# Patient Record
Sex: Male | Born: 1970 | Race: White | Hispanic: No | Marital: Married | Smoking: Never smoker
Health system: Southern US, Community
[De-identification: ages and names within clinical notes are randomized; demographics above are authoritative.]

## PROBLEM LIST (undated history)

## (undated) DIAGNOSIS — E785 Hyperlipidemia, unspecified: Secondary | ICD-10-CM

## (undated) DIAGNOSIS — I2510 Atherosclerotic heart disease of native coronary artery without angina pectoris: Secondary | ICD-10-CM

## (undated) DIAGNOSIS — I10 Essential (primary) hypertension: Secondary | ICD-10-CM

## (undated) DIAGNOSIS — E119 Type 2 diabetes mellitus without complications: Secondary | ICD-10-CM

## (undated) DIAGNOSIS — R7302 Impaired glucose tolerance (oral): Secondary | ICD-10-CM

## (undated) HISTORY — DX: Hyperlipidemia, unspecified: E78.5

## (undated) HISTORY — DX: Atherosclerotic heart disease of native coronary artery without angina pectoris: I25.10

## (undated) HISTORY — DX: Type 2 diabetes mellitus without complications: E11.9

## (undated) HISTORY — DX: Essential (primary) hypertension: I10

## (undated) HISTORY — DX: Impaired glucose tolerance (oral): R73.02

## (undated) HISTORY — PX: NASAL SINUS SURGERY: SHX719

---

## 2001-08-09 ENCOUNTER — Emergency Department (HOSPITAL_COMMUNITY): Admission: EM | Admit: 2001-08-09 | Discharge: 2001-08-09 | Payer: Self-pay | Admitting: Emergency Medicine

## 2001-08-09 ENCOUNTER — Encounter: Payer: Self-pay | Admitting: Emergency Medicine

## 2007-03-01 ENCOUNTER — Emergency Department (HOSPITAL_COMMUNITY): Admission: EM | Admit: 2007-03-01 | Discharge: 2007-03-02 | Payer: Self-pay | Admitting: Emergency Medicine

## 2009-07-29 ENCOUNTER — Encounter: Admission: RE | Admit: 2009-07-29 | Discharge: 2009-07-29 | Payer: Self-pay | Admitting: Family Medicine

## 2015-02-25 ENCOUNTER — Telehealth: Payer: Self-pay | Admitting: Cardiology

## 2015-02-25 NOTE — Telephone Encounter (Signed)
Received [facility] [employer] for appointment with Dr Antoine PocheHochrein on 03/22/15.  [facility] for Dr Hochrein's schedule on 03/22/15.  lp

## 2015-03-22 ENCOUNTER — Ambulatory Visit (INDEPENDENT_AMBULATORY_CARE_PROVIDER_SITE_OTHER): Payer: 59 | Admitting: Cardiology

## 2015-03-22 ENCOUNTER — Other Ambulatory Visit: Payer: Self-pay | Admitting: Cardiology

## 2015-03-22 ENCOUNTER — Encounter: Payer: Self-pay | Admitting: Cardiology

## 2015-03-22 VITALS — BP 114/80 | Wt 221.8 lb

## 2015-03-22 DIAGNOSIS — R072 Precordial pain: Secondary | ICD-10-CM

## 2015-03-22 DIAGNOSIS — I10 Essential (primary) hypertension: Secondary | ICD-10-CM | POA: Diagnosis not present

## 2015-03-22 NOTE — Progress Notes (Signed)
Cardiology Office Note   Date:  03/23/2015   ID:  Brian GanderBryant Tung, DOB December 16, 1970, MRN [id number]  PCP:  Mickie HillierLITTLE,KEVIN LORNE, MD  Cardiologist:   Rollene RotundaJames Loneta Tamplin, MD   Chief Complaint  Patient presents with  . Chest Pain      History of Present Illness: Brian Duarte is a 44 y.o. male who presents for evaluation of chest pain. He has a remarkable family history for coronary disease. His identical twin brother just had 5 vessel bypass in both of his parents have early heart disease at a young age. The patient himself at the end of March had some chest discomfort while he was out on a vigorous walk. He notices burning after the walk. It did not occur during the walk. It stopped on its own. There were no associated symptoms. He had never had this before. He does not have any shortness of breath, PND or orthopnea. He does not have palpitations, presyncope or syncope. He doesn't exercise routinely. He's not had any prior cardiac testing or workup.    Past Medical History  Diagnosis Date  . Impaired glucose tolerance   . HTN (hypertension)   . Hyperlipidemia     Past Surgical History  Procedure Laterality Date  . Nasal sinus surgery       Current Outpatient Prescriptions  Medication Sig Dispense Refill  . aspirin 81 MG chewable tablet Chew 1 tablet by mouth daily.    Marland Kitchen. BENICAR HCT 40-25 MG per tablet Take 1 tablet by mouth daily.  1  . CRESTOR 10 MG tablet Take 10 mg by mouth daily.  1  . KLOR-CON M10 10 MEQ tablet Take 10 mEq by mouth daily.  12   No current facility-administered medications for this visit.    Allergies:   Review of patient's allergies indicates no known allergies.    Social History:  The patient  reports that he has never smoked. He does not have any smokeless tobacco history on file. He reports that he drinks about 0.6 oz of alcohol per week. He reports that he does not use illicit drugs.   Family History:  The patient's family history includes CAD (age of  onset: 1743) in his brother; CAD (age of onset: 5054) in his mother; CAD (age of onset: 7355) in his father; Diabetes in his mother.    ROS:  Please see the history of present illness.   Otherwise, review of systems are positive for none.   All other systems are reviewed and negative.    PHYSICAL EXAM: VS:  BP 114/80 mmHg  Wt 221 lb 12.8 oz (100.608 kg) , BMI There is no height on file to calculate BMI. GENERAL:  Well appearing HEENT:  Pupils equal round and reactive, fundi not visualized, oral mucosa unremarkable NECK:  No jugular venous distention, waveform within normal limits, carotid upstroke brisk and symmetric, no bruits, no thyromegaly LYMPHATICS:  No cervical, inguinal adenopathy LUNGS:  Clear to auscultation bilaterally BACK:  No CVA tenderness CHEST:  Unremarkable HEART:  PMI not displaced or sustained,S1 and S2 within normal limits, no S3, no S4, no clicks, no rubs, no murmurs ABD:  Flat, positive bowel sounds normal in frequency in pitch, no bruits, no rebound, no guarding, no midline pulsatile mass, no hepatomegaly, no splenomegaly EXT:  2 plus pulses throughout, no edema, no cyanosis no clubbing SKIN:  No rashes no nodules NEURO:  Cranial nerves II through XII grossly intact, motor grossly intact throughout PSYCH:  Cognitively intact, oriented  to person place and time    EKG:  EKG is ordered today. The ekg ordered today demonstrates normal sinus rhythm, rate 70, axis within normal limits, intervals within normal limits, no acute ST-T wave changes.   Recent Labs: No results found for requested labs within last 365 days.    Lipid Panel No results found for: CHOL, TRIG, HDL, CHOLHDL, VLDL, LDLCALC, LDLDIRECT    Wt Readings from Last 3 Encounters:  03/22/15 221 lb 12.8 oz (100.608 kg)      Other studies Reviewed: Additional studies/ records that were reviewed today include: Dr. Fredirick Maudlin office records. Review of the above records demonstrates:  Please see elsewhere in  the note.     ASSESSMENT AND PLAN:  CHEST PAIN:  The patient had one episode of exertional chest discomfort. He's had no other symptoms. However, he has a very significant family history. I'm going to exclude obstructive coronary disease which I think can be done with a reasonable degree of certainty with a maximal exercise treadmill test. I'm also going to risk stratify further with a coronary calcium score. He and I began the discussion about follow-up of any abnormal results and primary risk reduction.  DM:  The patient has borderline diabetes which we managed by his primary provider. I suspect this would be treatable with lifestyle changes.  DYSLIPIDEMIA:  Agree with aggressive risk reduction and lipid management given his family history and might suggest lipid fractionation.  HTN:  The blood pressure is at target. No change in medications is indicated. We will continue with therapeutic lifestyle changes (TLC).   Current medicines are reviewed at length with the patient today.  The patient does not have concerns regarding medicines.  The following changes have been made:  no change  Labs/ tests ordered today include:   Orders Placed This Encounter  Procedures  . CT Cardiac Scoring  . EKG 12-Lead  . Exercise tolerance test     Disposition:   FU with me in one year.     Signed, Rollene Rotunda, MD  03/23/2015 9:49 AM    [facility]

## 2015-03-22 NOTE — Patient Instructions (Signed)
Your physician wants you to follow-up in: 1 Year You will receive a reminder letter in the mail two months in advance. If you don't receive a letter, please call our office to schedule the follow-up appointment.  Your physician has requested that you have an exercise tolerance test. For further information please visit [email or web address]. Please also follow instruction sheet, as given.  Your Doctor has ordered a CT calcium score - this is done at our [facility] - [address]

## 2015-03-25 ENCOUNTER — Ambulatory Visit (INDEPENDENT_AMBULATORY_CARE_PROVIDER_SITE_OTHER)
Admission: RE | Admit: 2015-03-25 | Discharge: 2015-03-25 | Disposition: A | Discharge status: Home / Self Care | Payer: 59 | Source: Ambulatory Visit | Attending: Cardiology | Admitting: Cardiology

## 2015-03-25 DIAGNOSIS — R072 Precordial pain: Secondary | ICD-10-CM

## 2015-03-25 DIAGNOSIS — I10 Essential (primary) hypertension: Secondary | ICD-10-CM

## 2015-04-14 ENCOUNTER — Telehealth (HOSPITAL_COMMUNITY): Payer: Self-pay | Admitting: *Deleted

## 2015-04-14 NOTE — Telephone Encounter (Signed)
Left message on voicemail in reference to upcoming appointment scheduled for 04/16/15. Phone number given for a call back so details instructions can be given. Brian Duarte W   

## 2015-04-15 ENCOUNTER — Telehealth (HOSPITAL_COMMUNITY): Payer: Self-pay | Admitting: *Deleted

## 2015-04-15 NOTE — Telephone Encounter (Signed)
Patient given detailed instructions per Myocardial Perfusion Study Information Sheet for test on 04/16/15 at 1530. Patient verbalized understanding. Brayleigh Rybacki W   

## 2015-04-16 ENCOUNTER — Ambulatory Visit (HOSPITAL_COMMUNITY)
Admission: RE | Admit: 2015-04-16 | Discharge: 2015-04-16 | Disposition: A | Discharge status: Home / Self Care | Payer: 59 | Source: Ambulatory Visit | Attending: Cardiology | Admitting: Cardiology

## 2015-04-16 DIAGNOSIS — R072 Precordial pain: Secondary | ICD-10-CM | POA: Diagnosis not present

## 2015-04-16 LAB — EXERCISE TOLERANCE TEST
CHL CUP RESTING HR STRESS: 78 {beats}/min
CHL CUP STRESS STAGE 1 DBP: 82 mmHg
CHL CUP STRESS STAGE 1 GRADE: 0 %
CHL CUP STRESS STAGE 1 HR: 84 {beats}/min
CHL CUP STRESS STAGE 2 HR: 83 {beats}/min
CHL CUP STRESS STAGE 2 SPEED: 1 mph
CHL CUP STRESS STAGE 3 GRADE: 0.1 %
CHL CUP STRESS STAGE 3 SPEED: 1 mph
CHL CUP STRESS STAGE 4 SBP: 141 mmHg
CHL CUP STRESS STAGE 5 GRADE: 12 %
CHL CUP STRESS STAGE 6 DBP: 72 mmHg
CHL CUP STRESS STAGE 6 GRADE: 14 %
CHL CUP STRESS STAGE 6 HR: 151 {beats}/min
CHL CUP STRESS STAGE 7 HR: 164 {beats}/min
CHL CUP STRESS STAGE 7 SPEED: 4.2 mph
CHL CUP STRESS STAGE 8 GRADE: 0 %
CHL CUP STRESS STAGE 8 SBP: 128 mmHg
CHL CUP STRESS STAGE 8 SPEED: 0 mph
CHL CUP STRESS STAGE 9 GRADE: 0 %
CSEPED: 10 min
CSEPEW: 12.5 METS
CSEPHR: 92 %
CSEPPMHR: 92 %
Exercise duration (sec): 30 s
MPHR: 177 {beats}/min
Peak HR: 164 {beats}/min
RPE: 29143
Stage 1 SBP: 122 mmHg
Stage 1 Speed: 0 mph
Stage 2 Grade: 0 %
Stage 3 HR: 82 {beats}/min
Stage 4 DBP: 65 mmHg
Stage 4 Grade: 10 %
Stage 4 HR: 109 {beats}/min
Stage 4 Speed: 1.7 mph
Stage 5 DBP: 67 mmHg
Stage 5 HR: 130 {beats}/min
Stage 5 SBP: 169 mmHg
Stage 5 Speed: 2.5 mph
Stage 6 SBP: 193 mmHg
Stage 6 Speed: 3.4 mph
Stage 7 Grade: 16 %
Stage 8 DBP: 66 mmHg
Stage 8 HR: 137 {beats}/min
Stage 9 DBP: 61 mmHg
Stage 9 HR: 90 {beats}/min
Stage 9 SBP: 131 mmHg
Stage 9 Speed: 0 mph

## 2017-05-24 DIAGNOSIS — R7301 Impaired fasting glucose: Secondary | ICD-10-CM | POA: Diagnosis not present

## 2017-05-24 DIAGNOSIS — I10 Essential (primary) hypertension: Secondary | ICD-10-CM | POA: Diagnosis not present

## 2017-05-24 DIAGNOSIS — E782 Mixed hyperlipidemia: Secondary | ICD-10-CM | POA: Diagnosis not present

## 2017-05-24 DIAGNOSIS — Z125 Encounter for screening for malignant neoplasm of prostate: Secondary | ICD-10-CM | POA: Diagnosis not present

## 2018-04-03 DIAGNOSIS — R7301 Impaired fasting glucose: Secondary | ICD-10-CM | POA: Diagnosis not present

## 2018-04-03 DIAGNOSIS — I10 Essential (primary) hypertension: Secondary | ICD-10-CM | POA: Diagnosis not present

## 2018-04-03 DIAGNOSIS — Z125 Encounter for screening for malignant neoplasm of prostate: Secondary | ICD-10-CM | POA: Diagnosis not present

## 2018-04-03 DIAGNOSIS — E782 Mixed hyperlipidemia: Secondary | ICD-10-CM | POA: Diagnosis not present

## 2019-04-10 DIAGNOSIS — R7301 Impaired fasting glucose: Secondary | ICD-10-CM | POA: Diagnosis not present

## 2019-04-10 DIAGNOSIS — E782 Mixed hyperlipidemia: Secondary | ICD-10-CM | POA: Diagnosis not present

## 2019-04-10 DIAGNOSIS — I10 Essential (primary) hypertension: Secondary | ICD-10-CM | POA: Diagnosis not present

## 2020-11-28 NOTE — Progress Notes (Unsigned)
Cardiology Office Note:   Date:  11/30/2020  NAME:  Brian Duarte    MRN: [id number] DOB:  Mar 07, 1971   PCP:  Hulan Fess, MD  Cardiologist:  No primary care provider on file.   Referring MD: Hulan Fess, MD   Chief Complaint  Patient presents with  . Coronary Artery Disease   History of Present Illness:   Brian Duarte is a 50 y.o. male with a hx of CAD, HTN who is being seen today for the evaluation of CAD at the request of Little, Lennette Bihari, MD.  He reports on 11/22/2020 he was visiting family and had severely elevated blood pressure.  Blood pressure reported to be 170/100.  He reports he was dizzy and lightheaded.  He does take blood pressure medication and blood pressure has come down.  In office today 140/93.  He has seen his primary care physician who thinks the cuff was a bit too tight.  He has a strong family history of heart disease.  His twin brother had bypass surgery at age 74.  He had coronary calcium scoring 5 years ago that demonstrated a calcium score of 1.  His EKG today in office is normal sinus rhythm with no acute ischemic changes.  He recently had lab work by his primary care physician.  He is now diabetic with an A1c of 6.5.  Most recent TSH 2.2.  Most recent LDL cholesterol 79 in May 2021.  His BMI is 34.  No concerns for sleep apnea per his report.  He reports his diet could be improved.  This does consist of packaged food and processed food.  He also does not exercise currently.  He is hoping to get back into that.  Reports no significant symptoms concerning for angina.  He reports he can get short of breath when he exerts himself.  He also describes episodic burning in his chest that can go into the right chest.  Can occur at any time.  Does not appear to occur with exertion.  He had no what he describes as chest pain or pressure.  He is a never smoker.  Does not drink alcohol or use drugs.  He works in Librarian, academic.  He is married with 1 child.  Problem List 1.  CAD -CAC score 1 (27th percentile) 2016 2. HTN 3. DM -A1c 6.5 4. Obesity  -BMI 34 5.  Hyperlipidemia -Total cholesterol 138, HDL 38, LDL 79, triglycerides 117  Past Medical History: Past Medical History:  Diagnosis Date  . Coronary artery disease   . Diabetes mellitus without complication ([place])   . HTN (hypertension)   . Hyperlipidemia   . Impaired glucose tolerance     Past Surgical History: Past Surgical History:  Procedure Laterality Date  . NASAL SINUS SURGERY      Current Medications: Current Meds  Medication Sig  . rosuvastatin (CRESTOR) 20 MG tablet Take 1 tablet (20 mg total) by mouth daily.     Allergies:    Patient has no known allergies.   Social History: Social History   Socioeconomic History  . Marital status: Married    Spouse name: Not on file  . Number of children: 1  . Years of education: Not on file  . Highest education level: Not on file  Occupational History  . Not on file  Tobacco Use  . Smoking status: Never Smoker  . Smokeless tobacco: Current User    Types: Snuff  Substance and Sexual Activity  . Alcohol use: Yes  Alcohol/week: 1.0 standard drink    Types: 1 Cans of beer per week  . Drug use: No  . Sexual activity: Yes    Partners: Female  Other Topics Concern  . Not on file  Social History Narrative   Lives at home with wife and daughter.  Works for [employer] brands    Social Determinants of Corporate investment banker Strain: Not on file  Food Insecurity: Not on file  Transportation Needs: Not on file  Physical Activity: Not on file  Stress: Not on file  Social Connections: Not on file     Family History: The patient's family history includes CAD (age of onset: 80) in his brother; CAD (age of onset: 52) in his mother; CAD (age of onset: 14) in his father; Diabetes in his mother.  ROS:   All other ROS reviewed and negative. Pertinent positives noted in the HPI.     EKGs/Labs/Other Studies Reviewed:   The following  studies were personally reviewed by me today:  EKG:  EKG is ordered today.  The ekg ordered today demonstrates normal sinus rhythm, heart rate 70, no acute ischemic changes, no evidence of prior infarction, and was personally reviewed by me.   Recent Labs: No results found for requested labs within last 8760 hours.   Recent Lipid Panel No results found for: CHOL, TRIG, HDL, CHOLHDL, VLDL, LDLCALC, LDLDIRECT  Physical Exam:   VS:  BP (!) 140/93   Pulse 70   Temp 98.1 F (36.7 C)   Ht 5\' 9"  (1.753 m)   Wt 231 lb 9.6 oz (105.1 kg)   SpO2 96%   BMI 34.20 kg/m    Wt Readings from Last 3 Encounters:  11/30/20 231 lb 9.6 oz (105.1 kg)  03/22/15 221 lb 12.8 oz (100.6 kg)    General: Well nourished, well developed, in no acute distress Head: Atraumatic, normal size  Eyes: PEERLA, EOMI  Neck: Supple, no JVD Endocrine: No thryomegaly Cardiac: Normal S1, S2; RRR; no murmurs, rubs, or gallops Lungs: Clear to auscultation bilaterally, no wheezing, rhonchi or rales  Abd: Soft, nontender, no hepatomegaly  Ext: No edema, pulses 2+ Musculoskeletal: No deformities, BUE and BLE strength normal and equal Skin: Warm and dry, no rashes   Neuro: Alert and oriented to person, place, time, and situation, CNII-XII grossly intact, no focal deficits  Psych: Normal mood and affect   ASSESSMENT:   Brian Duarte is a 50 y.o. male who presents for the following: 1. Agatston coronary artery calcium score less than 100   2. Primary hypertension   3. Mixed hyperlipidemia   4. Obesity (BMI 30-39.9)     PLAN:   1. Agatston coronary artery calcium score less than 100 -Coronary calcium score of 1 (27th percentile) in 2016.  Very strong family history of heart disease.  His twin brother had bypass surgery at 60.  I would recommend to repeat a coronary calcium score.  I would also recommend he increase his Crestor to 20 mg a day.  I would get his LDL cholesterol less than 70 as he has a strong family history and  is diabetic with an A1c of 6.5. -EKG normal in office today.  He may need further stress testing pending coronary calcium scoring.  2. Primary hypertension -Blood pressure 140/93 in office.  He is on olmesartan and HCTZ.  We discussed ways to reduce salt consumption.  He also needs exercise.  Weight loss would help lower his blood pressure as well as  proper diet.  BMI is 34.  I think he is safe to exercise.  He will start to do this.  No concern for sleep apnea per his report.  Recent thyroid studies are normal.  3. Mixed hyperlipidemia -LDL 79.  Not at goal.  Given his diabetes need to get this less than 70.  We will increase his Crestor 20 mg a day.  Repeat calcium scoring as above.  4. Obesity (BMI 30-39.9) -Diet and exercise recommended.  Disposition: Return in about 3 months (around 02/28/2021).  Medication Adjustments/Labs and Tests Ordered: Current medicines are reviewed at length with the patient today.  Concerns regarding medicines are outlined above.  Orders Placed This Encounter  Procedures  . CT CARDIAC SCORING (SELF PAY ONLY)  . EKG 12-Lead   Meds ordered this encounter  Medications  . rosuvastatin (CRESTOR) 20 MG tablet    Sig: Take 1 tablet (20 mg total) by mouth daily.    Dispense:  90 tablet    Refill:  3    Patient Instructions  Medication Instructions:  Increase Crestor 20 mg daily   *If you need a refill on your cardiac medications before your next appointment, please call your pharmacy*   Testing/Procedures: CALCIUM SCORE    Follow-Up: At [facility], you and your health needs are our priority.  As part of our continuing mission to provide you with exceptional heart care, we have created designated Provider Care Teams.  These Care Teams include your primary Cardiologist (physician) and Advanced Practice Providers (APPs -  Physician Assistants and Nurse Practitioners) who all work together to provide you with the care you need, when you need it.  We  recommend signing up for the patient portal called "MyChart".  Sign up information is provided on this After Visit Summary.  MyChart is used to connect with patients for Virtual Visits (Telemedicine).  Patients are able to view lab/test results, encounter notes, upcoming appointments, etc.  Non-urgent messages can be sent to your provider as well.   To learn more about what you can do with MyChart, go to [email or web address].    Your next appointment:   3 month(s)  The format for your next appointment:   In Person  Provider:   Lennie Odor, MD   Other Instructions Use BP log to monitor blood pressures       Signed, Gerri Spore T. Flora Lipps, MD [facility]  [address], [address] [place], [place] [address] [phone]  11/30/2020 10:10 AM

## 2020-11-30 ENCOUNTER — Ambulatory Visit (INDEPENDENT_AMBULATORY_CARE_PROVIDER_SITE_OTHER): Payer: 59 | Admitting: Cardiovascular Disease

## 2020-11-30 ENCOUNTER — Encounter: Payer: Self-pay | Admitting: Cardiovascular Disease

## 2020-11-30 ENCOUNTER — Other Ambulatory Visit: Payer: Self-pay

## 2020-11-30 VITALS — BP 140/93 | HR 70 | Temp 98.1°F | Ht 69.0 in | Wt 231.6 lb

## 2020-11-30 DIAGNOSIS — R931 Abnormal findings on diagnostic imaging of heart and coronary circulation: Secondary | ICD-10-CM

## 2020-11-30 DIAGNOSIS — E669 Obesity, unspecified: Secondary | ICD-10-CM | POA: Diagnosis not present

## 2020-11-30 DIAGNOSIS — R9431 Abnormal electrocardiogram [ECG] [EKG]: Secondary | ICD-10-CM

## 2020-11-30 DIAGNOSIS — I10 Essential (primary) hypertension: Secondary | ICD-10-CM | POA: Diagnosis not present

## 2020-11-30 DIAGNOSIS — E782 Mixed hyperlipidemia: Secondary | ICD-10-CM

## 2020-11-30 MED ORDER — ROSUVASTATIN CALCIUM 20 MG PO TABS: 20.0000 mg | ORAL_TABLET | Freq: Every day | ORAL | 3 refills | Status: DC

## 2020-11-30 NOTE — Patient Instructions (Signed)
Medication Instructions:  Increase Crestor 20 mg daily   *If you need a refill on your cardiac medications before your next appointment, please call your pharmacy*   Testing/Procedures: CALCIUM SCORE    Follow-Up: At [facility], you and your health needs are our priority.  As part of our continuing mission to provide you with exceptional heart care, we have created designated Provider Care Teams.  These Care Teams include your primary Cardiologist (physician) and Advanced Practice Providers (APPs -  Physician Assistants and Nurse Practitioners) who all work together to provide you with the care you need, when you need it.  We recommend signing up for the patient portal called "MyChart".  Sign up information is provided on this After Visit Summary.  MyChart is used to connect with patients for Virtual Visits (Telemedicine).  Patients are able to view lab/test results, encounter notes, upcoming appointments, etc.  Non-urgent messages can be sent to your provider as well.   To learn more about what you can do with MyChart, go to [email or web address].    Your next appointment:   3 month(s)  The format for your next appointment:   In Person  Provider:   Lennie Odor, MD   Other Instructions Use BP log to monitor blood pressures

## 2020-12-02 ENCOUNTER — Ambulatory Visit (INDEPENDENT_AMBULATORY_CARE_PROVIDER_SITE_OTHER)
Admission: RE | Admit: 2020-12-02 | Discharge: 2020-12-02 | Disposition: A | Discharge status: Home / Self Care | Payer: Self-pay | Source: Ambulatory Visit | Attending: Cardiovascular Disease | Admitting: Cardiovascular Disease

## 2020-12-02 ENCOUNTER — Other Ambulatory Visit: Payer: Self-pay

## 2020-12-02 DIAGNOSIS — R931 Abnormal findings on diagnostic imaging of heart and coronary circulation: Secondary | ICD-10-CM

## 2020-12-03 ENCOUNTER — Other Ambulatory Visit: Payer: Self-pay

## 2020-12-03 DIAGNOSIS — I2510 Atherosclerotic heart disease of native coronary artery without angina pectoris: Secondary | ICD-10-CM

## 2020-12-03 DIAGNOSIS — R931 Abnormal findings on diagnostic imaging of heart and coronary circulation: Secondary | ICD-10-CM

## 2020-12-03 NOTE — Telephone Encounter (Signed)
Discussed calcium score with Brian Duarte. No concerning chest pain. I discussed with him that I think it is okay to proceed with a stress test.  There is no need for right heart catheterization at this time.  We will await the results of his stress test and then decide on any further testing he may need.  I am quite aware of his brothers medical history and that is why we pursued a repeat calcium scoring.  We will do everything we can to ensure his heart remains healthy and we stay on top of this.  Gerri Spore T. Flora Lipps, MD [facility]  [address], [address] [place], [place] [address] [phone]  3:09 PM

## 2020-12-06 ENCOUNTER — Other Ambulatory Visit: Payer: Self-pay

## 2020-12-06 ENCOUNTER — Other Ambulatory Visit (HOSPITAL_COMMUNITY)
Admission: RE | Admit: 2020-12-06 | Discharge: 2020-12-06 | Disposition: A | Discharge status: Home / Self Care | Payer: 59 | Source: Ambulatory Visit | Attending: Cardiovascular Disease | Admitting: Cardiovascular Disease

## 2020-12-06 DIAGNOSIS — Z20822 Contact with and (suspected) exposure to covid-19: Secondary | ICD-10-CM | POA: Diagnosis not present

## 2020-12-06 DIAGNOSIS — I2510 Atherosclerotic heart disease of native coronary artery without angina pectoris: Secondary | ICD-10-CM

## 2020-12-06 DIAGNOSIS — Z01812 Encounter for preprocedural laboratory examination: Secondary | ICD-10-CM | POA: Insufficient documentation

## 2020-12-06 LAB — SARS CORONAVIRUS 2 (TAT 6-24 HRS): SARS Coronavirus 2: NEGATIVE

## 2020-12-08 ENCOUNTER — Telehealth: Payer: Self-pay

## 2020-12-08 NOTE — Telephone Encounter (Signed)
Detailed instructions left on the patient's answering machine. Asked to call back with any questions. S.Fortune Brannigan EMTP 

## 2020-12-09 ENCOUNTER — Ambulatory Visit (HOSPITAL_COMMUNITY): Payer: 59 | Attending: Cardiovascular Disease

## 2020-12-09 ENCOUNTER — Other Ambulatory Visit: Payer: Self-pay

## 2020-12-09 DIAGNOSIS — I2510 Atherosclerotic heart disease of native coronary artery without angina pectoris: Secondary | ICD-10-CM | POA: Diagnosis not present

## 2020-12-09 LAB — MYOCARDIAL PERFUSION IMAGING
Estimated workload: 12.3 METS
Exercise duration (min): 8 min
Exercise duration (sec): 45 s
LV dias vol: 76 mL (ref 62–150)
LV sys vol: 34 mL
MPHR: 171 {beats}/min
Peak HR: 164 {beats}/min
Percent HR: 96 %
RPE: 19
Rest HR: 61 {beats}/min
SDS: 0
SRS: 0
SSS: 0
TID: 0.91

## 2020-12-09 MED ORDER — TECHNETIUM TC 99M TETROFOSMIN IV KIT
10.9000 | PACK | Freq: Once | INTRAVENOUS | Status: AC | PRN
  Administered 2020-12-09: 10.9 via INTRAVENOUS
  Filled 2020-12-09: qty 11

## 2020-12-09 MED ORDER — TECHNETIUM TC 99M TETROFOSMIN IV KIT
31.4000 | PACK | Freq: Once | INTRAVENOUS | Status: AC | PRN
  Administered 2020-12-09: 31.4 via INTRAVENOUS
  Filled 2020-12-09: qty 32

## 2020-12-13 NOTE — Progress Notes (Signed)
Virtual Visit via Video Note   This visit type was conducted due to national recommendations for restrictions regarding the COVID-19 Pandemic (e.g. social distancing) in an effort to limit this patient's exposure and mitigate transmission in our community.  Due to his co-morbid illnesses, this patient is at least at moderate risk for complications without adequate follow up.  This format is felt to be most appropriate for this patient at this time.  All issues noted in this document were discussed and addressed.  A limited physical exam was performed with this format.  Please refer to the patient's chart for his consent to telehealth for [facility].  Date:  12/14/2020   ID:  Brian Duarte, DOB 1971/06/19, MRN [id number] The patient was identified using 2 identifiers.  Patient Location: Home Provider Location: Office/Clinic  PCP:  Catha Gosselin, MD  Cardiologist:  No primary care provider on file.   Evaluation Performed:  Follow-Up Visit  Chief Complaint:  CAD  History of Present Illness:    Brian Duarte is a 50 y.o. male with CAD (elevated CAC score), DM, HLD, obesity who presents for follow-up. He reports he has been doing well. BP has been a bit elevated. No exercise until our discussion. Reports he still gets intermittent burning sensation in his chest. Non-exertional. Occurs at any time. Not that frequent. 2-3/week. Lasts minutes. No exertional CP or pressure. Crestor increased to 20 mg daily. Reports elevated liver enzymes and he will see his PCP to discuss tomorrow. I have asked he forward the results. We discussed diet and exercise goals as well as weight loss goals. He reports he is motivated to lose weight and get back in shape.   Problem List 1. CAD -CAC score 744 (99th percentile) (12/02/2020) -negative MPI 12/09/2020 2. HTN 3. DM -A1c 6.5 4. Obesity  -BMI 34 5.  Hyperlipidemia -Total cholesterol 138, HDL 38, LDL 79, triglycerides 117  The patient does not have symptoms  concerning for COVID-19 infection (fever, chills, cough, or new shortness of breath).    Past Medical History:  Diagnosis Date  . Coronary artery disease   . Diabetes mellitus without complication ([facility])   . HTN (hypertension)   . Hyperlipidemia   . Impaired glucose tolerance    Past Surgical History:  Procedure Laterality Date  . NASAL SINUS SURGERY       Current Meds  Medication Sig  . omeprazole (PRILOSEC) 20 MG capsule Take 1 capsule (20 mg total) by mouth daily.     Allergies:   Patient has no known allergies.   Social History   Tobacco Use  . Smoking status: Never Smoker  . Smokeless tobacco: Current User    Types: Snuff  Substance Use Topics  . Alcohol use: Yes    Alcohol/week: 1.0 standard drink    Types: 1 Cans of beer per week  . Drug use: No     Family Hx: The patient's family history includes CAD (age of onset: 16) in his brother; CAD (age of onset: 82) in his mother; CAD (age of onset: 78) in his father; Diabetes in his mother.  ROS:   Please see the history of present illness.     All other systems reviewed and are negative.   Prior CV studies:   The following studies were reviewed today:  NM Stress 12/09/2020   The left ventricular ejection fraction is normal (55-65%).  Nuclear stress EF: 56%.  Blood pressure demonstrated a normal response to exercise.  There was no ST  segment deviation noted during stress.  The study is normal.  This is a low risk study.   Normal resting and stress perfusion. No ischemia or infarction EF 56% Normal ETT portion to test as well   CAC score 744 (99th percentile)  Labs/Other Tests and Data Reviewed:    EKG:  No ECG reviewed.  Recent Labs: No results found for requested labs within last 8760 hours.   Recent Lipid Panel No results found for: CHOL, TRIG, HDL, CHOLHDL, LDLCALC, LDLDIRECT  Wt Readings from Last 3 Encounters:  12/14/20 222 lb (100.7 kg)  12/09/20 231 lb (104.8 kg)  11/30/20 231 lb  9.6 oz (105.1 kg)     Objective:    Vital Signs:  Wt 222 lb (100.7 kg)   BMI 32.78 kg/m    VITAL SIGNS:  reviewed  Gen: NAD by video visit Pulm: normal respirations by video visit Psych: normal mood and affect   ASSESSMENT & PLAN:    1. Coronary artery disease involving native coronary artery of native heart without angina pectoris 2. Agatston coronary artery calcium score greater than 400 3. Mixed hyperlipidemia -CAC score 744 (99th percentile) (12/02/2020) -negative MPI 12/09/2020 -no symptoms concerning for angina. Intermittent burning. We will try prilosec 20 mg daily to see if this helps -continue asa 81 mg daily -continue crestor 20 mg daily. Goal LDL <70. I have asked him to forward me his recent liver profile. As long as <3 ULN ok to remain on statin.  -150 min/week mod vigorous physical activity -diet should reduce carbs and be more of lean meats -reduce animal fat -He will send me log of BP values   4. Primary hypertension -continue current meds. He will send his log.   COVID-19 Education: The signs and symptoms of COVID-19 were discussed with the patient and how to seek care for testing (follow up with PCP or arrange E-visit).  The importance of social distancing was discussed today.  Time:  Today, I have spent 25 minutes with the patient with telehealth technology discussing the above problems.    Medication Adjustments/Labs and Tests Ordered: Current medicines are reviewed at length with the patient today.  Concerns regarding medicines are outlined above.   Tests Ordered: Orders Placed This Encounter  Procedures  . Lipid panel  . Hepatic function panel    Medication Changes: Meds ordered this encounter  Medications  . omeprazole (PRILOSEC) 20 MG capsule    Sig: Take 1 capsule (20 mg total) by mouth daily.    Dispense:  30 capsule    Refill:  11    Follow Up:  In Person in 3 month(s)  Signed, Reatha Harps, MD  12/14/2020 3:49 PM    [facility]

## 2020-12-14 ENCOUNTER — Ambulatory Visit: Payer: 59 | Admitting: Cardiology

## 2020-12-14 ENCOUNTER — Encounter: Payer: Self-pay | Admitting: Cardiovascular Disease

## 2020-12-14 ENCOUNTER — Telehealth (INDEPENDENT_AMBULATORY_CARE_PROVIDER_SITE_OTHER): Payer: 59 | Admitting: Cardiovascular Disease

## 2020-12-14 VITALS — Wt 222.0 lb

## 2020-12-14 DIAGNOSIS — I2510 Atherosclerotic heart disease of native coronary artery without angina pectoris: Secondary | ICD-10-CM | POA: Diagnosis not present

## 2020-12-14 DIAGNOSIS — E782 Mixed hyperlipidemia: Secondary | ICD-10-CM

## 2020-12-14 DIAGNOSIS — R931 Abnormal findings on diagnostic imaging of heart and coronary circulation: Secondary | ICD-10-CM | POA: Diagnosis not present

## 2020-12-14 DIAGNOSIS — I10 Essential (primary) hypertension: Secondary | ICD-10-CM | POA: Diagnosis not present

## 2020-12-14 MED ORDER — OMEPRAZOLE 20 MG PO CPDR: 20.0000 mg | DELAYED_RELEASE_CAPSULE | Freq: Every day | ORAL | 11 refills | Status: DC

## 2020-12-14 NOTE — Patient Instructions (Signed)
Medication Instructions:  Start Omeprazole 20 mg daily   *If you need a refill on your cardiac medications before your next appointment, please call your pharmacy*   Lab Work: LIPID, LIVER in 4 weeks- (no lab appointment needed, come fasting) If you have labs (blood work) drawn today and your tests are completely normal, you will receive your results only by: Marland Kitchen MyChart Message (if you have MyChart) OR . A paper copy in the mail If you have any lab test that is abnormal or we need to change your treatment, we will call you to review the results.   Follow-Up: At [facility], you and your health needs are our priority.  As part of our continuing mission to provide you with exceptional heart care, we have created designated Provider Care Teams.  These Care Teams include your primary Cardiologist (physician) and Advanced Practice Providers (APPs -  Physician Assistants and Nurse Practitioners) who all work together to provide you with the care you need, when you need it.  We recommend signing up for the patient portal called "MyChart".  Sign up information is provided on this After Visit Summary.  MyChart is used to connect with patients for Virtual Visits (Telemedicine).  Patients are able to view lab/test results, encounter notes, upcoming appointments, etc.  Non-urgent messages can be sent to your provider as well.   To learn more about what you can do with MyChart, go to [email or web address].    Your next appointment:   Keep follow up in April.

## 2020-12-15 MED ORDER — OMEPRAZOLE 20 MG PO CPDR: 20.0000 mg | DELAYED_RELEASE_CAPSULE | Freq: Every day | ORAL | 3 refills | Status: AC

## 2020-12-15 NOTE — Addendum Note (Signed)
Addended by: Lindell Spar on: 12/15/2020 07:53 AM   Modules accepted: Orders

## 2021-02-15 ENCOUNTER — Encounter: Payer: Self-pay | Admitting: Cardiovascular Disease

## 2021-03-07 ENCOUNTER — Ambulatory Visit: Payer: 59 | Admitting: Cardiovascular Disease

## 2021-06-20 DIAGNOSIS — E782 Mixed hyperlipidemia: Secondary | ICD-10-CM

## 2021-06-21 NOTE — Progress Notes (Signed)
Cardiology Office Note:   Date:  06/22/2021  NAME:  Brian Duarte    MRN: [id number] DOB:  [birth year]   PCP:  Catha Gosselin, MD  Cardiologist:  None  Electrophysiologist:  None   Referring MD: Catha Gosselin, MD   Chief Complaint  Patient presents with   Follow-up   History of Present Illness:   Brian Duarte is a 50 y.o. male with a hx of CAD (elevated coronary calcium score, normal MPI), hypertension, diabetes, hyperlipidemia who presents for follow-up.  He reports he is doing well.  Denies any chest pain or shortness of breath.  He is not exercising.  We did discuss this is important.  He is working on his diet.  We did discuss the need to reduce red meat as well as fatty and fried food consumption.  He is working on this.  His blood pressure is 118/74.  Well-controlled on Benicar.  Most recent A1c 6.6 he was started on metformin by his primary care physician.  We did increase his Crestor to 20 mg daily at her last visit.  Most recent LDL was 79.  He needs lab draws today.  Overall he seems to be doing well without any major symptoms.  He was counseled extensively on cardiovascular symptoms to look out for.  Overall doing well denies any symptoms in office today.  Problem List 1. CAD -CAC score 744 (99th percentile) (12/02/2020) -negative MPI 12/09/2020 2. HTN 3. DM -A1c 6.6 4. Obesity -BMI 34 5.  Hyperlipidemia -Total cholesterol 138, HDL 38, LDL 79, triglycerides 117  Past Medical History: Past Medical History:  Diagnosis Date   Coronary artery disease    Diabetes mellitus without complication (HCC)    HTN (hypertension)    Hyperlipidemia    Impaired glucose tolerance     Past Surgical History: Past Surgical History:  Procedure Laterality Date   NASAL SINUS SURGERY      Current Medications: Current Meds  Medication Sig   aspirin 81 MG chewable tablet Chew 1 tablet by mouth daily.   BENICAR HCT 40-25 MG per tablet Take 1 tablet by mouth daily.   metFORMIN (GLUCOPHAGE)  500 MG tablet Take 500 mg by mouth daily with breakfast.   rosuvastatin (CRESTOR) 20 MG tablet Take 1 tablet (20 mg total) by mouth daily.     Allergies:    Patient has no known allergies.   Social History: Social History   Socioeconomic History   Marital status: Married    Spouse name: Not on file   Number of children: 1   Years of education: Not on file   Highest education level: Not on file  Occupational History   Not on file  Tobacco Use   Smoking status: Never   Smokeless tobacco: Current    Types: Snuff  Substance and Sexual Activity   Alcohol use: Yes    Alcohol/week: 1.0 standard drink    Types: 1 Cans of beer per week   Drug use: No   Sexual activity: Yes    Partners: Female  Other Topics Concern   Not on file  Social History Narrative   Lives at home with wife and daughter.  Works for [employer] brands    Social Determinants of Corporate investment banker Strain: Not on file  Food Insecurity: Not on file  Transportation Needs: Not on file  Physical Activity: Not on file  Stress: Not on file  Social Connections: Not on file     Family History: The patient's  family history includes CAD (age of onset: 4) in his brother; CAD (age of onset: 60) in his mother; CAD (age of onset: 37) in his father; Diabetes in his mother.  ROS:   All other ROS reviewed and negative. Pertinent positives noted in the HPI.     EKGs/Labs/Other Studies Reviewed:   The following studies were personally reviewed by me today:  ETT 12/09/2020  The left ventricular ejection fraction is normal (55-65%). Nuclear stress EF: 56%. Blood pressure demonstrated a normal response to exercise. There was no ST segment deviation noted during stress. The study is normal. This is a low risk study.   Normal resting and stress perfusion. No ischemia or infarction EF 56% Normal ETT portion to test as well  Calcium score 12/02/2020  IMPRESSION: Coronary calcium score of 744. This was 99th percentile  for age and sex matched controls.  Recent Labs: No results found for requested labs within last 8760 hours.   Recent Lipid Panel No results found for: CHOL, TRIG, HDL, CHOLHDL, VLDL, LDLCALC, LDLDIRECT  Physical Exam:   VS:  BP 118/74 (BP Location: Left Arm, Patient Position: Sitting, Cuff Size: Large)   Pulse 67   Ht 5\' 9"  (1.753 m)   Wt 227 lb (103 kg)   BMI 33.52 kg/m    Wt Readings from Last 3 Encounters:  06/22/21 227 lb (103 kg)  12/14/20 222 lb (100.7 kg)  12/09/20 231 lb (104.8 kg)    General: Well nourished, well developed, in no acute distress Head: Atraumatic, normal size  Eyes: PEERLA, EOMI  Neck: Supple, no JVD Endocrine: No thryomegaly Cardiac: Normal S1, S2; RRR; no murmurs, rubs, or gallops Lungs: Clear to auscultation bilaterally, no wheezing, rhonchi or rales  Abd: Soft, nontender, no hepatomegaly  Ext: No edema, pulses 2+ Musculoskeletal: No deformities, BUE and BLE strength normal and equal Skin: Warm and dry, no rashes   Neuro: Alert and oriented to person, place, time, and situation, CNII-XII grossly intact, no focal deficits  Psych: Normal mood and affect   ASSESSMENT:   Brian Duarte is a 50 y.o. male who presents for the following: 1. Coronary artery disease involving native coronary artery of native heart without angina pectoris   2. Agatston coronary artery calcium score greater than 400   3. Mixed hyperlipidemia   4. Primary hypertension     PLAN:   1. Coronary artery disease involving native coronary artery of native heart without angina pectoris 2. Agatston coronary artery calcium score greater than 400 3. Mixed hyperlipidemia -Elevated coronary calcium score 744 which is the 99 percentile.  Underwent myocardial perfusion imaging study.  This was normal.  No symptoms of angina. -Counseled extensively on exercise goals as well as diet.  He will work on this. -BP well controlled.  No change in medications. -Continue aspirin 81 mg daily. -He  is on Crestor 20 mg daily.  Needs a recheck on his lipids today.  He is fasting.  We will obtain this.  Goal LDL cholesterol less than 70. -He will see 54 yearly.  4. Primary hypertension -Well-controlled.  No change in medications.  Disposition: Return in about 1 year (around 06/22/2022).  Medication Adjustments/Labs and Tests Ordered: Current medicines are reviewed at length with the patient today.  Concerns regarding medicines are outlined above.  Orders Placed This Encounter  Procedures   Lipid panel    No orders of the defined types were placed in this encounter.   Patient Instructions  Medication Instructions:  The current  medical regimen is effective;  continue present plan and medications.  *If you need a refill on your cardiac medications before your next appointment, please call your pharmacy*   Lab Work: LIPID today   If you have labs (blood work) drawn today and your tests are completely normal, you will receive your results only by: MyChart Message (if you have MyChart) OR A paper copy in the mail If you have any lab test that is abnormal or we need to change your treatment, we will call you to review the results.   Follow-Up: At [facility], you and your health needs are our priority.  As part of our continuing mission to provide you with exceptional heart care, we have created designated Provider Care Teams.  These Care Teams include your primary Cardiologist (physician) and Advanced Practice Providers (APPs -  Physician Assistants and Nurse Practitioners) who all work together to provide you with the care you need, when you need it.  We recommend signing up for the patient portal called "MyChart".  Sign up information is provided on this After Visit Summary.  MyChart is used to connect with patients for Virtual Visits (Telemedicine).  Patients are able to view lab/test results, encounter notes, upcoming appointments, etc.  Non-urgent messages can be sent to your  provider as well.   To learn more about what you can do with MyChart, go to [email or web address].    Your next appointment:   12 month(s)  The format for your next appointment:   In Person  Provider:   Lennie Odor, MD      Time Spent with Patient: I have spent a total of 35 minutes with patient reviewing hospital notes, telemetry, EKGs, labs and examining the patient as well as establishing an assessment and plan that was discussed with the patient.  > 50% of time was spent in direct patient care.  Signed, Lenna Gilford. Flora Lipps, MD, [facility]  [facility]  [address] [place], [place] [address] [phone]  06/22/2021 8:44 AM

## 2021-06-22 ENCOUNTER — Other Ambulatory Visit: Payer: Self-pay

## 2021-06-22 ENCOUNTER — Encounter: Payer: Self-pay | Admitting: Cardiovascular Disease

## 2021-06-22 ENCOUNTER — Ambulatory Visit (INDEPENDENT_AMBULATORY_CARE_PROVIDER_SITE_OTHER): Payer: 59 | Admitting: Cardiovascular Disease

## 2021-06-22 VITALS — BP 118/74 | HR 67 | Ht 69.0 in | Wt 227.0 lb

## 2021-06-22 DIAGNOSIS — I10 Essential (primary) hypertension: Secondary | ICD-10-CM | POA: Diagnosis not present

## 2021-06-22 DIAGNOSIS — R931 Abnormal findings on diagnostic imaging of heart and coronary circulation: Secondary | ICD-10-CM

## 2021-06-22 DIAGNOSIS — E782 Mixed hyperlipidemia: Secondary | ICD-10-CM

## 2021-06-22 DIAGNOSIS — I2510 Atherosclerotic heart disease of native coronary artery without angina pectoris: Secondary | ICD-10-CM

## 2021-06-22 LAB — LIPID PANEL
Chol/HDL Ratio: 4.1 ratio (ref 0.0–5.0)
Cholesterol, Total: 131 mg/dL (ref 100–199)
HDL: 32 mg/dL — ABNORMAL LOW (ref >39)
LDL Chol Calc (NIH): 71 mg/dL (ref 0–99)
Triglycerides: 161 mg/dL — ABNORMAL HIGH (ref 0–149)
VLDL Cholesterol Cal: 28 mg/dL (ref 5–40)

## 2021-06-22 NOTE — Patient Instructions (Signed)
Medication Instructions:  The current medical regimen is effective;  continue present plan and medications.  *If you need a refill on your cardiac medications before your next appointment, please call your pharmacy*   Lab Work: LIPID today   If you have labs (blood work) drawn today and your tests are completely normal, you will receive your results only by: MyChart Message (if you have MyChart) OR A paper copy in the mail If you have any lab test that is abnormal or we need to change your treatment, we will call you to review the results.   Follow-Up: At [facility], you and your health needs are our priority.  As part of our continuing mission to provide you with exceptional heart care, we have created designated Provider Care Teams.  These Care Teams include your primary Cardiologist (physician) and Advanced Practice Providers (APPs -  Physician Assistants and Nurse Practitioners) who all work together to provide you with the care you need, when you need it.  We recommend signing up for the patient portal called "MyChart".  Sign up information is provided on this After Visit Summary.  MyChart is used to connect with patients for Virtual Visits (Telemedicine).  Patients are able to view lab/test results, encounter notes, upcoming appointments, etc.  Non-urgent messages can be sent to your provider as well.   To learn more about what you can do with MyChart, go to [email or web address].    Your next appointment:   12 month(s)  The format for your next appointment:   In Person  Provider:   Struble O'Neal, MD          

## 2021-06-23 ENCOUNTER — Other Ambulatory Visit: Payer: Self-pay

## 2021-06-23 MED ORDER — ROSUVASTATIN CALCIUM 40 MG PO TABS: 40.0000 mg | ORAL_TABLET | Freq: Every day | ORAL | 3 refills | Status: DC

## 2022-07-05 IMAGING — CT CT CARDIAC CORONARY ARTERY CALCIUM SCORE
3 series · 14 of 20 positions shown, 15 images · non-contrast
Comparison: None.
COMPARISON: None.

Addendum:
EXAM:
OVER-READ INTERPRETATION  CT CHEST

The following report is an over-read performed by radiologist Dr.
Dzezair Odmah Dostupno [REDACTED] on 12/02/2020. This
over-read does not include interpretation of cardiac or coronary
anatomy or pathology. The coronary calcium score/coronary CTA
interpretation by the cardiologist is attached.
CLINICAL DATA: Risk stratification
Coronary Calcium Score
TECHNIQUE: The patient was scanned on a Siemens Force scanner. Axial
non-contrast 3 mm slices were carried out through the heart. The
data set was analyzed on a dedicated work station and scored using
the Agatson method.

[Series 2: casc 3.0 bv41 2 bestdiast 73 % · axial · 0.40mm/px · z∈[-193,-130]mm · 4 of 36 slices shown, 5 images]
[im 8/36  vessel]
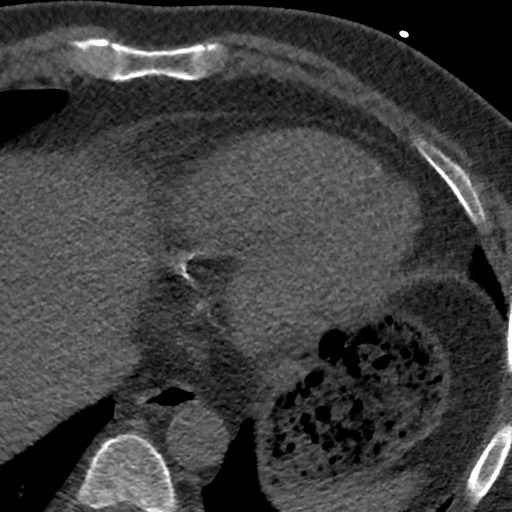
[im 8/36  lung]
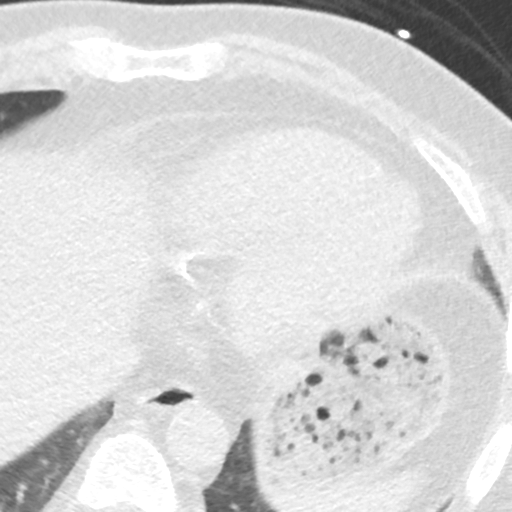
[im 15/36  vessel]
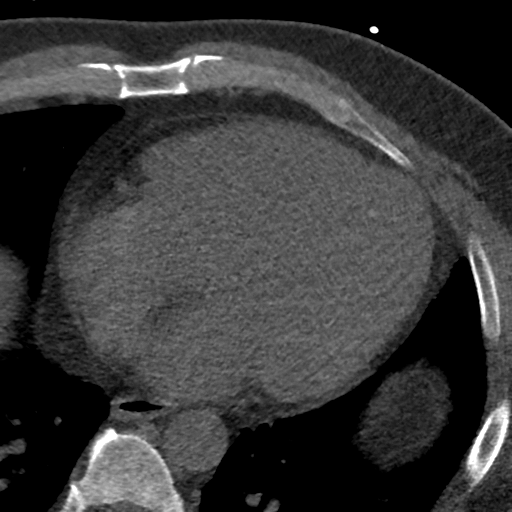
[im 22/36  vessel]
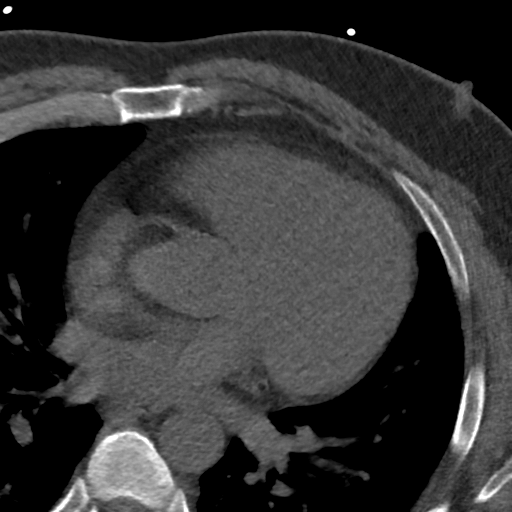
[im 29/36  vessel]
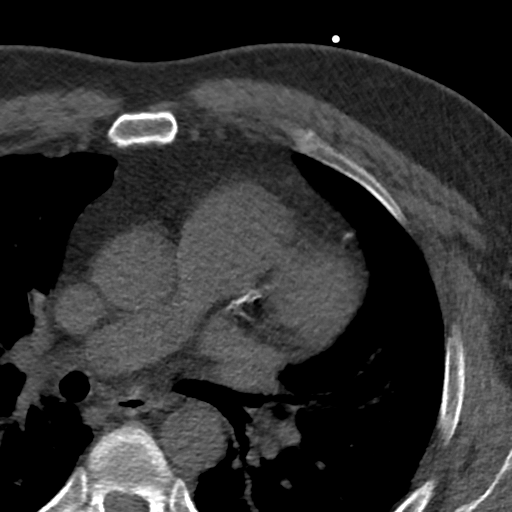

[Series 3: lung 73 % · axial · 0.75mm/px · z∈[-199,-127]mm · 5 of 36 slices shown]
[im 6/36  lung]
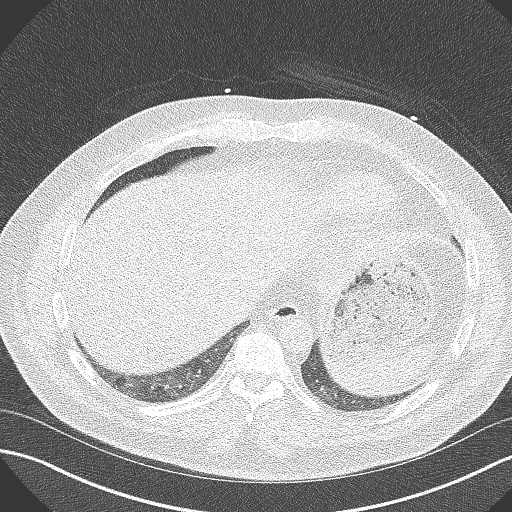
[im 12/36  lung]
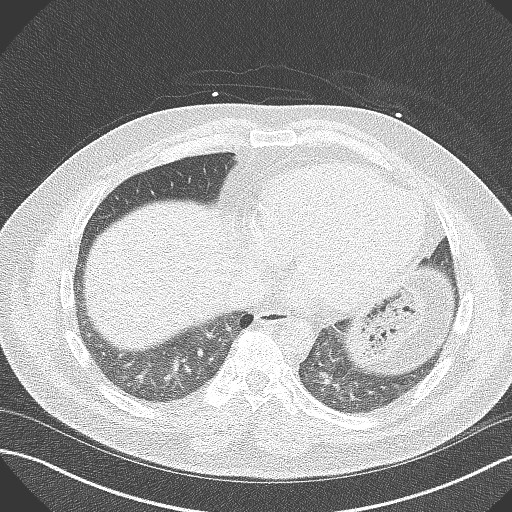
[im 18/36  lung]
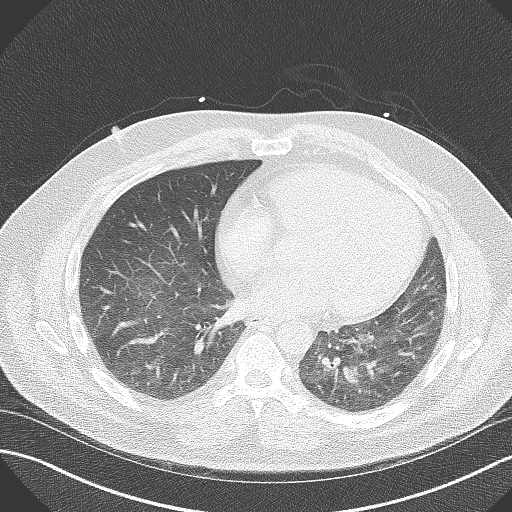
[im 24/36  lung]
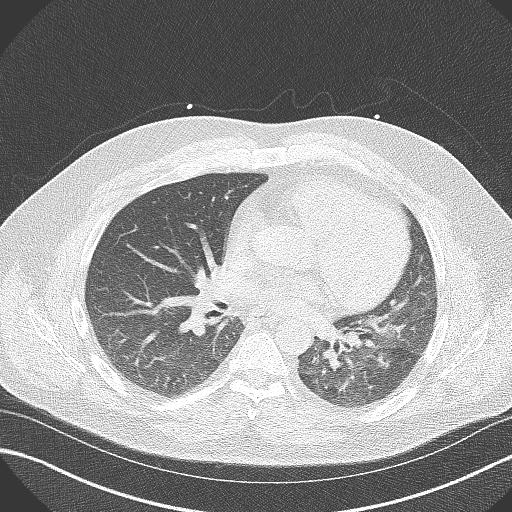
[im 30/36  lung]
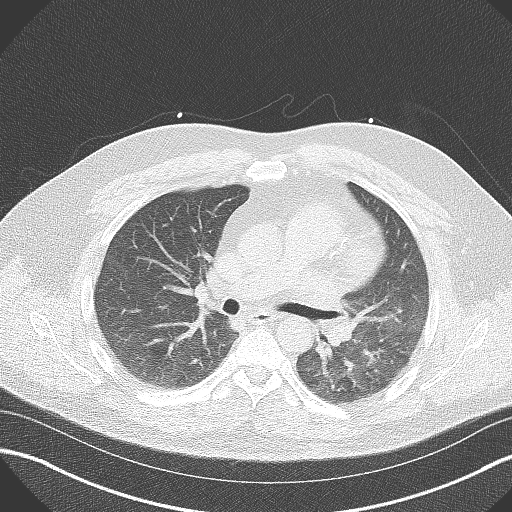

[Series 4: lung st 73 % · axial · 0.75mm/px · z∈[-199,-127]mm · 5 of 36 slices shown]
[im 6/36  lung]
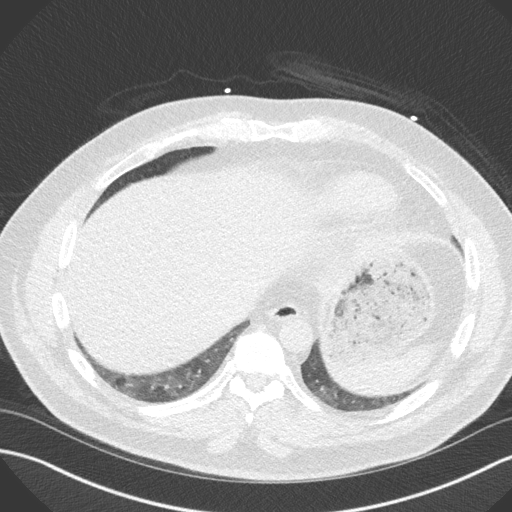
[im 12/36  lung]
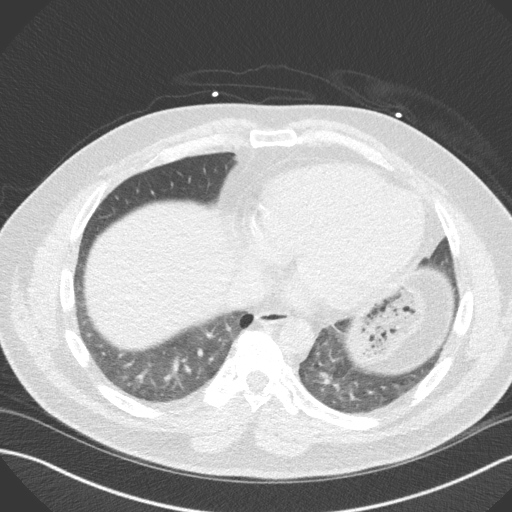
[im 18/36  lung]
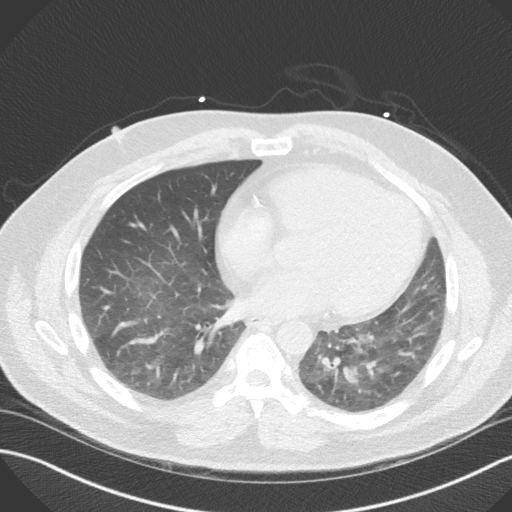
[im 24/36  lung]
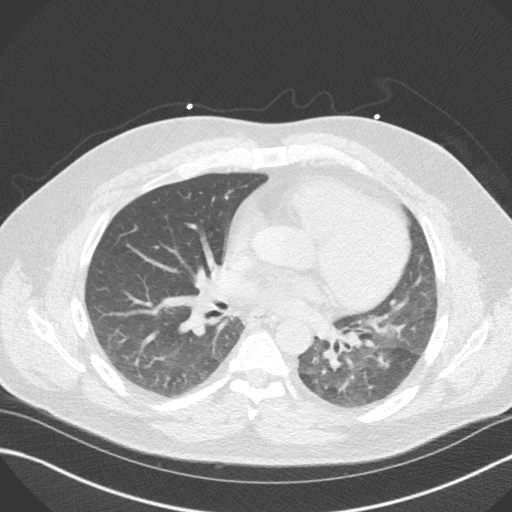
[im 30/36  lung]
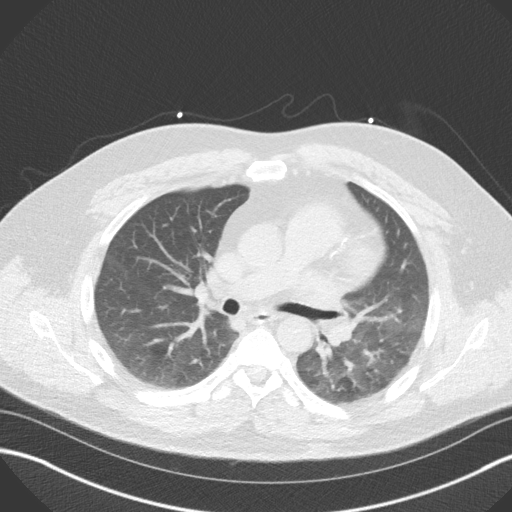

[14 of 20 positions shown; findings below may reference images not displayed]

FINDINGS: Within the visualized portions of the thorax there are no suspicious
appearing pulmonary nodules or masses, there is no acute
consolidative airspace disease, no pleural effusions, no
pneumothorax and no lymphadenopathy. Visualized portions of the
upper abdomen demonstrates diffuse low attenuation throughout the
visualized hepatic parenchyma, indicative of hepatic steatosis.
There are no aggressive appearing lytic or blastic lesions noted in
the visualized portions of the skeleton.
IMPRESSION: 1. Hepatic steatosis.
FINDINGS: Non-cardiac: See separate report from [REDACTED].

Ascending Aorta: Normal caliber.

Pericardium: Normal.

Coronary arteries: Normal origins.
IMPRESSION: Coronary calcium score of 744. This was 99th percentile for age and
sex matched controls.

*** End of Addendum ***
EXAM:
OVER-READ INTERPRETATION  CT CHEST

The following report is an over-read performed by radiologist Dr.
Dzezair Odmah Dostupno [REDACTED] on 12/02/2020. This
over-read does not include interpretation of cardiac or coronary
anatomy or pathology. The coronary calcium score/coronary CTA
interpretation by the cardiologist is attached.
FINDINGS: Within the visualized portions of the thorax there are no suspicious
appearing pulmonary nodules or masses, there is no acute
consolidative airspace disease, no pleural effusions, no
pneumothorax and no lymphadenopathy. Visualized portions of the
upper abdomen demonstrates diffuse low attenuation throughout the
visualized hepatic parenchyma, indicative of hepatic steatosis.
There are no aggressive appearing lytic or blastic lesions noted in
the visualized portions of the skeleton.
IMPRESSION: 1. Hepatic steatosis.

## 2022-07-23 ENCOUNTER — Other Ambulatory Visit: Payer: Self-pay | Admitting: Cardiovascular Disease

## 2022-09-27 ENCOUNTER — Other Ambulatory Visit: Payer: Self-pay | Admitting: Cardiovascular Disease

## 2022-10-27 ENCOUNTER — Other Ambulatory Visit: Payer: Self-pay | Admitting: Cardiovascular Disease

## 2022-11-28 ENCOUNTER — Other Ambulatory Visit: Payer: Self-pay | Admitting: Cardiovascular Disease

## 2022-11-29 ENCOUNTER — Other Ambulatory Visit: Payer: Self-pay | Admitting: Cardiovascular Disease

## 2023-02-02 ENCOUNTER — Other Ambulatory Visit: Payer: Self-pay | Admitting: Cardiovascular Disease

## 2023-03-02 ENCOUNTER — Other Ambulatory Visit: Payer: Self-pay | Admitting: Cardiovascular Disease

## 2024-08-04 ENCOUNTER — Other Ambulatory Visit: Payer: Self-pay | Admitting: Urology

## 2024-08-04 DIAGNOSIS — R972 Elevated prostate specific antigen [PSA]: Secondary | ICD-10-CM

## 2024-09-12 ENCOUNTER — Ambulatory Visit
Admission: RE | Admit: 2024-09-12 | Discharge: 2024-09-12 | Disposition: A | Discharge status: Home / Self Care | Source: Ambulatory Visit | Attending: Urology | Admitting: Urology

## 2024-09-12 DIAGNOSIS — R972 Elevated prostate specific antigen [PSA]: Secondary | ICD-10-CM

## 2024-09-12 MED ORDER — GADOPICLENOL 0.5 MMOL/ML IV SOLN
10.0000 mL | Freq: Once | INTRAVENOUS | Status: AC | PRN
  Administered 2024-09-12: 10 mL via INTRAVENOUS

## 2024-12-16 ENCOUNTER — Other Ambulatory Visit (HOSPITAL_COMMUNITY): Payer: Self-pay
